# Patient Record
Sex: Male | Born: 1955 | Race: White | Hispanic: No | Marital: Married | Smoking: Never smoker
Health system: Southern US, Community
[De-identification: ages and names within clinical notes are randomized; demographics above are authoritative.]

## PROBLEM LIST (undated history)

## (undated) DIAGNOSIS — M47812 Spondylosis without myelopathy or radiculopathy, cervical region: Secondary | ICD-10-CM

## (undated) DIAGNOSIS — E663 Overweight: Secondary | ICD-10-CM

## (undated) HISTORY — DX: Overweight: E66.3

## (undated) HISTORY — DX: Spondylosis without myelopathy or radiculopathy, cervical region: M47.812

## (undated) HISTORY — PX: VASECTOMY: SHX75

---

## 2003-03-25 ENCOUNTER — Ambulatory Visit (HOSPITAL_COMMUNITY): Admission: RE | Admit: 2003-03-25 | Discharge: 2003-03-25 | Payer: Self-pay | Admitting: Gastroenterology

## 2006-12-26 ENCOUNTER — Encounter: Admission: RE | Admit: 2006-12-26 | Discharge: 2007-01-18 | Payer: Self-pay | Admitting: Neurosurgery

## 2009-01-05 ENCOUNTER — Encounter: Admission: RE | Admit: 2009-01-05 | Discharge: 2009-01-05 | Payer: Self-pay | Admitting: Family Medicine

## 2010-01-18 IMAGING — RF DG UGI W/ HIGH DENSITY W/KUB
19 of 24 series · 19 of 24 positions shown · IV contrast (agent unspecified)
Comparison: None

CLINICAL DATA: Dysphagia

UPPER GI SERIES W/HIGH DENSITY W/KUB
TECHNIQUE: After obtaining a scout radiograph, upper GI series
performed with high density barium and effervescent agent. Thin
barium also used.
Fluoroscopy Time: 1.7 minutes
Contrast: Double contrast upper GI

[Series 1: run · 1 of 1 slices shown (1 of 19)]
[im 1/1]
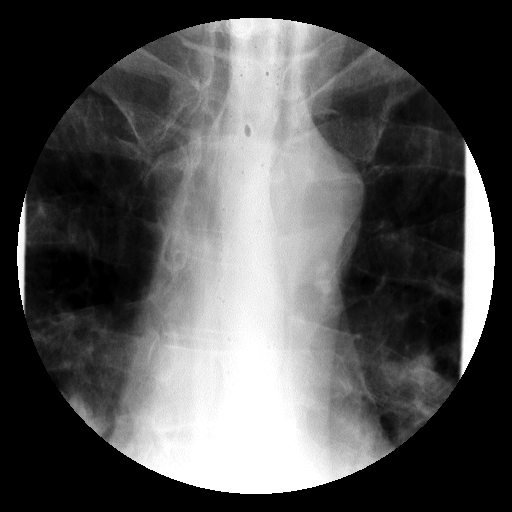

[Series 2: run · 1 of 1 slices shown (2 of 19)]
[im 1/1]
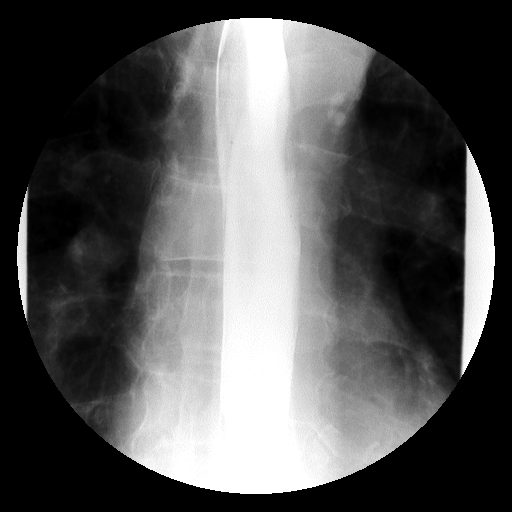

[Series 4: run · 1 of 1 slices shown (3 of 19)]
[im 1/1]
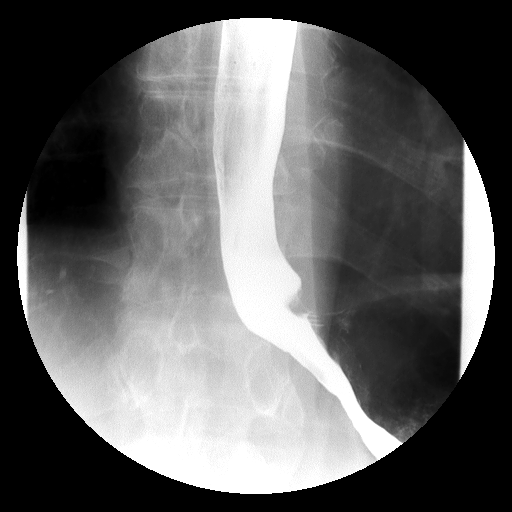

[Series 5: run · 1 of 1 slices shown (4 of 19)]
[im 1/1]
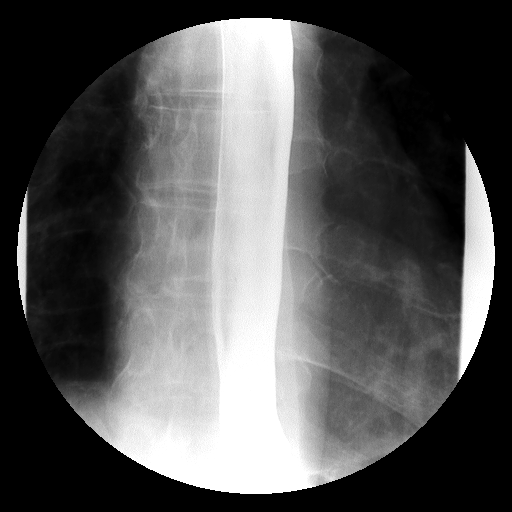

[Series 6: run · 1 of 1 slices shown (5 of 19)]
[im 1/1]
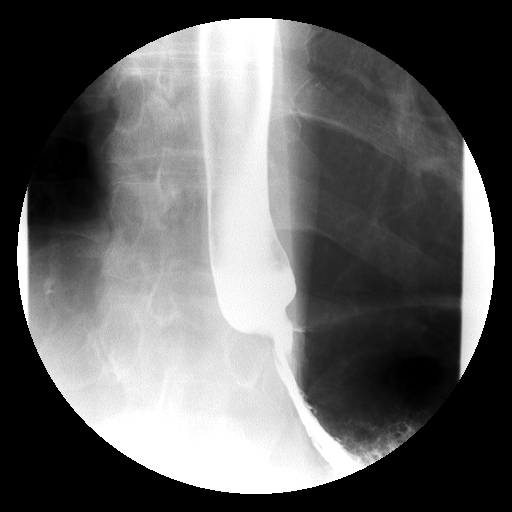

[Series 7: run · 1 of 1 slices shown (6 of 19)]
[im 1/1]
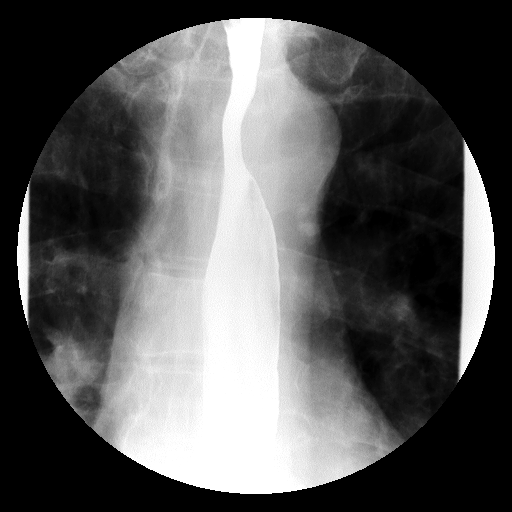

[Series 9: run · 1 of 1 slices shown (7 of 19)]
[im 1/1]
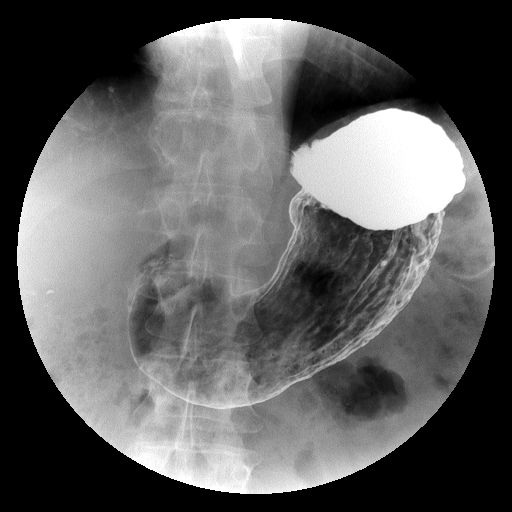

[Series 10: run · 1 of 1 slices shown (8 of 19)]
[im 1/1]
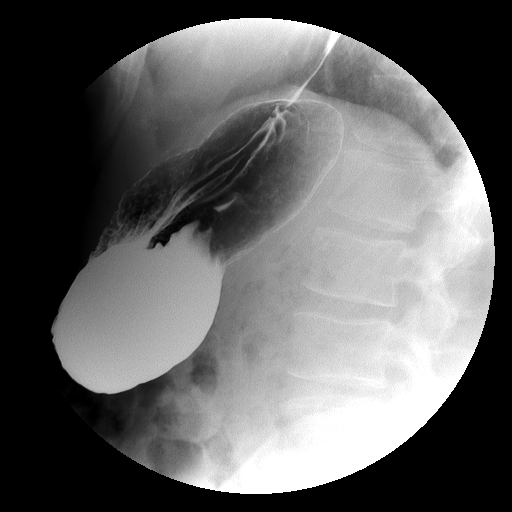

[Series 11: run · 1 of 1 slices shown (9 of 19)]
[im 1/1]
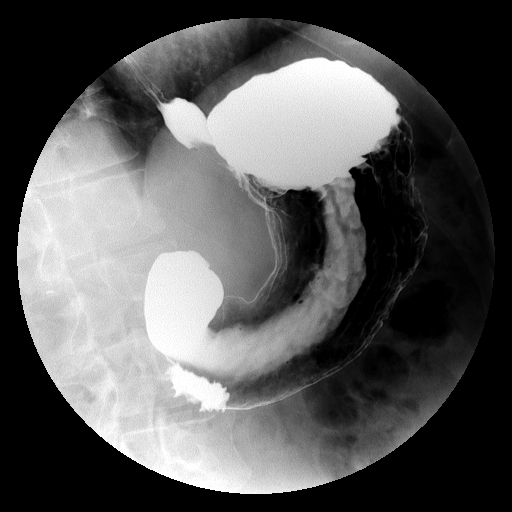

[Series 13: run · 1 of 1 slices shown (10 of 19)]
[im 1/1]
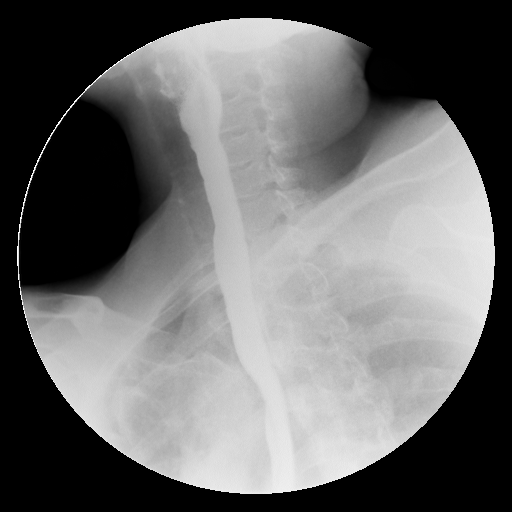

[Series 14: run · 1 of 1 slices shown (11 of 19)]
[im 1/1]
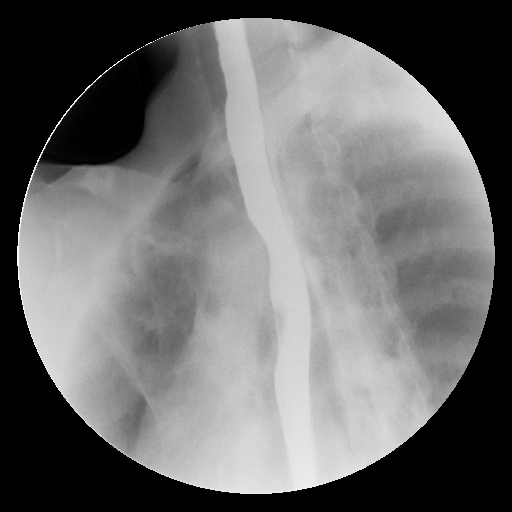

[Series 15: run · 1 of 1 slices shown (12 of 19)]
[im 1/1]
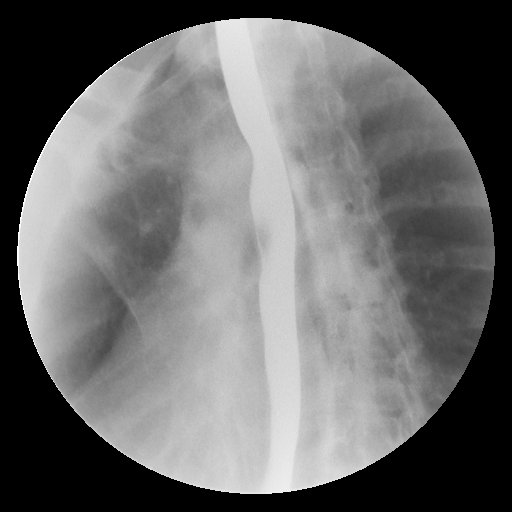

[Series 16: run · 1 of 1 slices shown (13 of 19)]
[im 1/1]
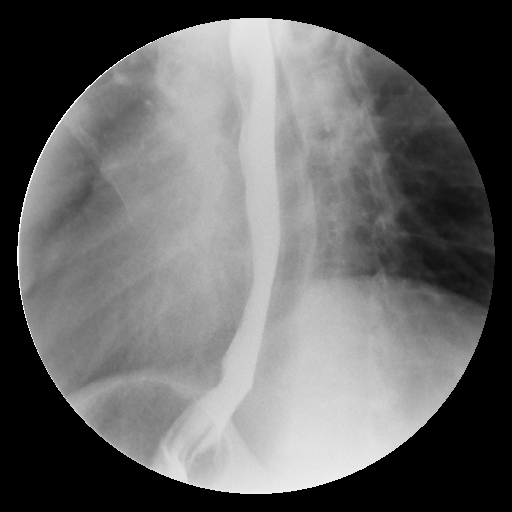

[Series 18: run · 1 of 1 slices shown (14 of 19)]
[im 1/1]
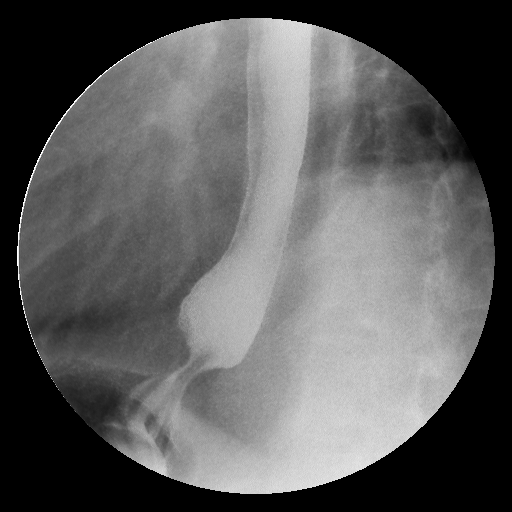

[Series 19: run · 1 of 1 slices shown (15 of 19)]
[im 1/1]
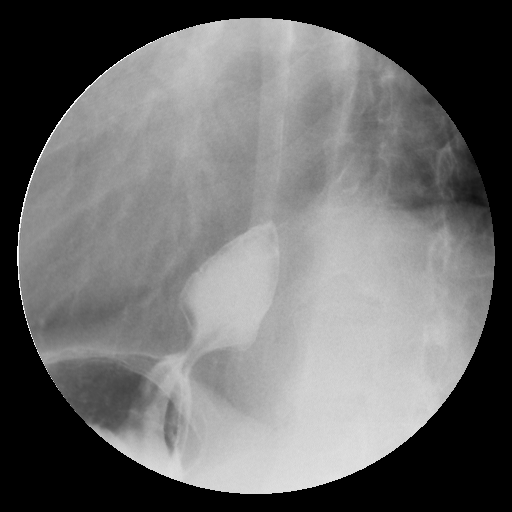

[Series 20: run · 1 of 1 slices shown (16 of 19)]
[im 1/1]
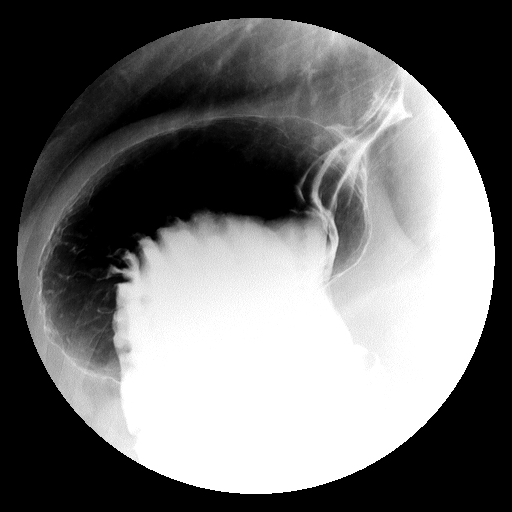

[Series 21: run · 1 of 1 slices shown (17 of 19)]
[im 1/1]
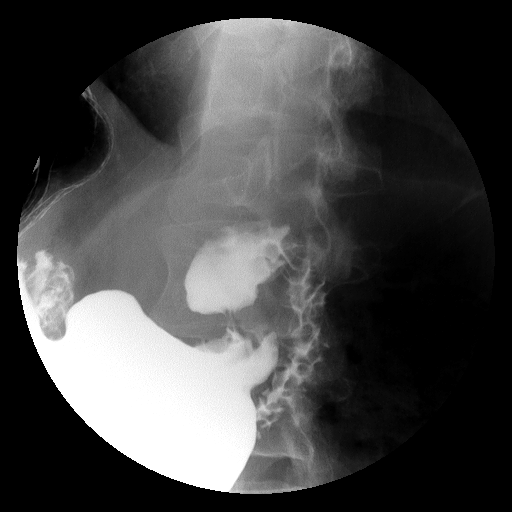

[Series 23: run · 1 of 1 slices shown (18 of 19)]
[im 1/1]
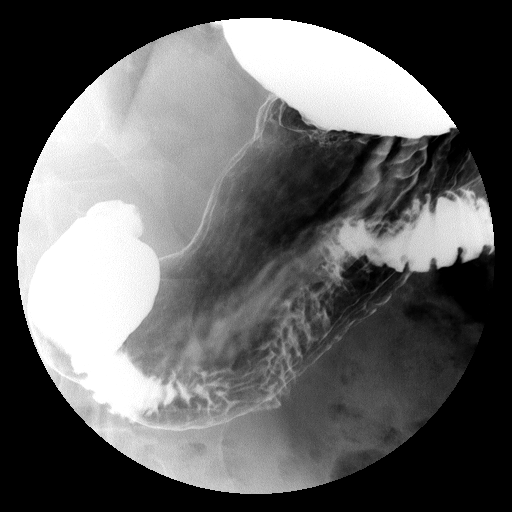

[Series 24: run · 1 of 1 slices shown (19 of 19)]
[im 1/1]
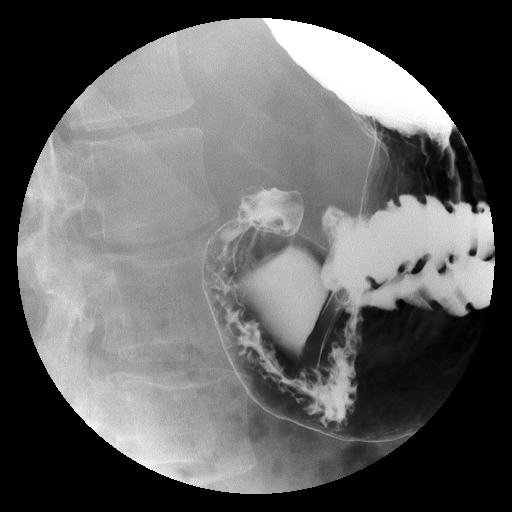

[19 of 24 positions shown; findings below may reference images not displayed]

FINDINGS: A preliminary film of the abdomen shows a nonspecific
bowel gas pattern.  No opaque calculi are noted.

A double contrast study shows the mucosa of the esophagus to be
normal.  A single contrast study shows the swallowing mechanism to
be normal.  Esophageal peristalsis is normal.  There is a small
hiatal hernia present.  Moderate gastroesophageal reflux is noted.
A barium pill was given at the end of the study which passed into
the stomach without delay.  The stomach is normal in contour and
peristalsis.  The duodenal bulb fills well with no ulceration and
the duodenal loop is in normal position.
IMPRESSION: 1.  Small hiatal hernia.
2.  Moderate gastroesophageal reflux.  A barium pill passes into
the stomach without delay.

## 2013-05-28 ENCOUNTER — Other Ambulatory Visit: Payer: Self-pay | Admitting: Otolaryngology

## 2013-05-28 DIAGNOSIS — J029 Acute pharyngitis, unspecified: Secondary | ICD-10-CM

## 2013-05-28 DIAGNOSIS — R1310 Dysphagia, unspecified: Secondary | ICD-10-CM

## 2013-06-03 ENCOUNTER — Ambulatory Visit
Admission: RE | Admit: 2013-06-03 | Discharge: 2013-06-03 | Disposition: A | Discharge status: Home / Self Care | Payer: Commercial Managed Care - PPO | Source: Ambulatory Visit | Attending: Otolaryngology | Admitting: Otolaryngology

## 2013-06-03 DIAGNOSIS — J029 Acute pharyngitis, unspecified: Secondary | ICD-10-CM

## 2013-06-03 DIAGNOSIS — R1310 Dysphagia, unspecified: Secondary | ICD-10-CM

## 2015-09-08 ENCOUNTER — Other Ambulatory Visit: Payer: Self-pay | Admitting: *Deleted

## 2015-09-08 ENCOUNTER — Ambulatory Visit (INDEPENDENT_AMBULATORY_CARE_PROVIDER_SITE_OTHER): Payer: BC Managed Care – PPO | Admitting: Neurology

## 2015-09-08 DIAGNOSIS — R200 Anesthesia of skin: Secondary | ICD-10-CM | POA: Diagnosis not present

## 2015-09-08 DIAGNOSIS — G609 Hereditary and idiopathic neuropathy, unspecified: Secondary | ICD-10-CM

## 2015-09-08 NOTE — Procedures (Addendum)
[facility]  [address], [address]  [place], [place] [address] Tel: [phone] Fax:  [phone] Test Date:  09/08/2015  Patient: Thomas Mclaughlin DOB: Apr 28, 1956 Physician: Narda Amber  Sex: Male Height: 6\' 5"  Ref Phys: Lujean Amel, M.D.  ID#: [id number] Temp: 33.1C Technician: Jerilynn Mages. Dean   Patient Complaints: This is a 59 year old gentleman presenting for evaluation of bilateral feet paresthesias.   NCV & EMG Findings: Extensive electrodiagnostic testing of the right lower extremity and additional studies of the left shows:  1. Left sural and bilateral superficial peroneal sensory responses are reduced in amplitude. The right sural sensory response is low normal. 2. Bilateral tibial and peroneal (EDB) motor responses shows reduced amplitude and mild conduction velocity slowing. Bilateral peroneal motor responses recording at the tibialis anterior are within normal limits. 3. Bilateral tibial H reflex studies are prolonged.   Left tibial F wave is absent. 4. There is no evidence of active or chronic motor axon loss changes affecting any of the tested muscles. Motor unit configuration and recruitment pattern is within normal limits.  Impression: 1. 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  A polyradiculoneuropathy is thought to be less likely; however, if symptoms evolve in the pattern atypical for peripheral neuropathy, further testing may be indicated. 2. There is no evidence of a lumbar radiculopathy.   ___________________________ Narda Amber, DO    Nerve Conduction Studies Anti Sensory Summary Table   Stim Site NR Peak (ms) Norm Peak (ms) P-T Amp (V) Norm P-T Amp  Left Sup Peroneal Anti Sensory (Ant Lat Mall)  33.1C  12 cm    3.3 <4.6 1.8 >4  Right Sup Peroneal Anti Sensory (Ant Lat Mall)  33.1C  12 cm    3.4 <4.6 3.6 >4    Left Sural Anti Sensory (Lat Mall)  33.1C  Calf    4.4 <4.6 3.9 >4  Right Sural Anti Sensory (Lat Mall)  33.1C  Calf    3.6 <4.6 4.2 >4   Motor Summary Table   Stim Site NR Onset (ms) Norm Onset (ms) O-P Amp (mV) Norm O-P Amp Site1 Site2 Delta-0 (ms) Dist (cm) Vel (m/s) Norm Vel (m/s)  Left Peroneal Motor (Ext Dig Brev)  Ankle    3.4 <6.0 1.7 >2.5 B Fib Ankle 10.7 42.0 39 >40  B Fib    14.1  1.5  Poplt B Fib 2.5 10.0 40 >40  Poplt    16.6  1.5         Right Peroneal Motor (Ext Dig Brev)  33.1C  Ankle    3.8 <6.0 1.4 >2.5 B Fib Ankle 9.9 39.0 39 >40  B Fib    13.7  0.7  Poplt B Fib 2.3 8.0 35 >40  Poplt    16.0  0.8         Left Peroneal TA Motor (Tib Ant)  Fib Head    1.9 <4.5 3.8 >3 Poplit Fib Head 2.1 10.0 48 >40  Poplit    4.0  3.6         Right Peroneal TA Motor (Tib Ant)  33.1C  Fib Head    2.9 <4.5 4.1 >3 Poplit Fib Head 1.5 10.0 46 >40  Poplit    4.4  3.4         Left Tibial Motor (Abd Hall Brev)  Ankle    3.6 <6.0 0.9 >4 Knee Ankle 13.4 45.0  34 >40  Knee    17.0  0.5         Right Tibial Motor (Abd Hall Brev)  33.1C  Ankle    4.0 <6.0 1.8 >4 Knee Ankle 12.6 45.0 36 >40  Knee    16.6  1.0          F Wave Studies   NR F-Lat (ms) Lat Norm (ms) L-R F-Lat (ms)  Left Tibial (Mrkrs) (Abd Hallucis)     NR <55    H Reflex Studies   NR H-Lat (ms) Lat Norm (ms) L-R H-Lat (ms)  Left Tibial (Gastroc)     40.00 <35 0.00  Right Tibial (Gastroc)  33.1C     40.00 <35 0.00   EMG   Side Muscle Ins Act Fibs Psw Fasc Number Recrt Dur Dur. Amp Amp. Poly Poly. Comment  Right AntTibialis Nml Nml Nml Nml Nml Nml Nml Nml Nml Nml Nml Nml N/A  Right Gastroc Nml Nml Nml Nml Nml Nml Nml Nml Nml Nml Nml Nml N/A  Right Flex Dig Long Nml Nml Nml Nml Nml Nml Nml Nml Nml Nml Nml Nml N/A  Right RectFemoris Nml Nml Nml Nml Nml Nml Nml Nml Nml Nml Nml Nml N/A  Right GluteusMed Nml Nml Nml Nml Nml Nml Nml Nml Nml Nml Nml Nml N/A  Right BicepsFemS Nml Nml Nml Nml Nml Nml Nml Nml Nml Nml  Nml Nml N/A  Right Lower Lumbar PSP Nml Nml Nml Nml Nml Nml Nml Nml Nml Nml Nml Nml N/A  Left Gastroc Nml Nml Nml Nml Nml Nml Nml Nml Nml Nml Nml Nml N/A  Left AntTibialis Nml Nml Nml Nml Nml Nml Nml Nml Nml Nml Nml Nml N/A      Waveforms:

## 2015-10-06 ENCOUNTER — Encounter: Payer: Self-pay | Admitting: *Deleted

## 2015-10-07 ENCOUNTER — Encounter: Payer: Self-pay | Admitting: Neurology

## 2015-10-07 ENCOUNTER — Ambulatory Visit (INDEPENDENT_AMBULATORY_CARE_PROVIDER_SITE_OTHER): Payer: BC Managed Care – PPO | Admitting: Neurology

## 2015-10-07 VITALS — BP 120/74 | HR 61 | Ht 77.0 in | Wt 228.0 lb

## 2015-10-07 DIAGNOSIS — G609 Hereditary and idiopathic neuropathy, unspecified: Secondary | ICD-10-CM | POA: Diagnosis not present

## 2015-10-07 NOTE — Patient Instructions (Addendum)
1.  Check blood work.  We will call you with the results.  2.  Return to clinic if symptoms worsen

## 2015-10-07 NOTE — Progress Notes (Signed)
[facility] Clinic Note - Initial Visit   Date: 10/07/2015  Thomas Mclaughlin MRN: [id number] DOB: 1956/05/29   Dear Dr. Dorthy Cooler  Thank you for your kind referral of Thomas Mclaughlin for consultation of neuropathy. Although his history is well known to you, please allow [name] to reiterate it for the purpose of our medical record. The patient was accompanied to the clinic by self.    History of Present Illness: Thomas Mclaughlin is a 59 y.o. right-handed Caucasian male with degenerative changes of the cervical spine presenting for evaluation of paresthesias of the feet.    Starting around 2010, he developed numbness involving the toes and sole of the foot which over the past two years also started involving the right foot. He has occasional tingling of the feet and on rare spells, he experienced sharp burning pain.  Symptoms are worse in the evening and relatively constant.  He denies any alleviating or exacerbating factors.  Denies imbalance or any falls.  During the summer of 2016, he was driving from [facility] and he noticed that he was having greater difficulty detecting pressure on the gas and brake pedals, but has not noticed this since then.   He has a 30-year history of stiffness involving the last two fingers, no associated numbness or tingling.  Denies any low back pain.   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   No history of diabetes or alcohol use.  He had EMG performed in September 2016 by myself which was consistent with a distal and symmetric polyneuropathy. Labs showed normal TSH and vitamin B12.   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  A polyradiculoneuropathy is thought to be less likely; however, if symptoms evolve in the pattern atypical for peripheral neuropathy, further testing may be indicated. There is no evidence of a lumbar radiculopathy.  Labs 09/10/2015:  vitamin B12 322, TSH 2.56  Past Medical History  Diagnosis Date  . DJD (degenerative joint disease), cervical   . Overweight     Past Surgical History  Procedure Laterality Date  . None       Medications:  Outpatient Encounter Prescriptions as of 10/07/2015  Medication Sig  . azelastine (ASTELIN) 0.1 % nasal spray Place into both nostrils 2 (two) times daily. Use in each nostril as directed  . phenylephrine (SUDAFED PE) 10 MG TABS tablet Take 10 mg by mouth every 4 (four) hours as needed.   No facility-administered encounter medications on file as of 10/07/2015.     Allergies:  Allergies  Allergen Reactions  . Amoxil [Amoxicillin]     Family History: Family History  Problem Relation Age of Onset  . CVA Father     Deceased  . Dementia Mother   . Breast cancer Mother     Deceased  . Leukemia Sister   . Breast cancer Sister   . Healthy Daughter   . Healthy Son   . Neuropathy Other     Neice    Social History: Social History  Substance Use Topics  . Smoking status: Never Smoker   . Smokeless tobacco: Never Used  . Alcohol Use: 0.0 oz/week    0 Standard drinks or  equivalent per week     Comment: 4-8 times per month, 1 glass of wine   Social History   Social History Narrative   Married with 2 children.     Self employed in publishing.  Lives in a 2 story home.     Education: Oceanographer.  Starting a new job on October 12, 2015.      Review of Systems:  CONSTITUTIONAL: No fevers, chills, night sweats, or weight loss.   EYES: No visual changes or eye pain ENT: No hearing changes.  No history of nose bleeds.   RESPIRATORY: No cough, wheezing and shortness of breath.   CARDIOVASCULAR: Negative for  chest pain, and palpitations.   GI: Negative for abdominal discomfort, blood in stools or black stools.  No recent change in bowel habits.   GU:  No history of incontinence.   MUSCLOSKELETAL: No history of joint pain or swelling.  No myalgias.   SKIN: Negative for lesions, rash, and itching.   HEMATOLOGY/ONCOLOGY: Negative for prolonged bleeding, bruising easily, and swollen nodes.  No history of cancer.   ENDOCRINE: Negative for cold or heat intolerance, polydipsia or goiter.   PSYCH:  No depression or anxiety symptoms.   NEURO: As Above.   Vital Signs:  BP 120/74 mmHg  Pulse 61  Ht '6\' 5"'  (1.956 m)  Wt 228 lb (103.42 kg)  BMI 27.03 kg/m2  SpO2 99% Pain Scale: 0 on a scale of 0-10   General Medical Exam:   General:  Well appearing, comfortable.   Eyes/ENT: see cranial nerve examination.   Neck: No masses appreciated.  Full range of motion without tenderness.  No carotid bruits. Respiratory:  Clear to auscultation, good air entry bilaterally.   Cardiac:  Regular rate and rhythm, no murmur.   Extremities:  No deformities, edema, or skin discoloration.  Skin:  No rashes or lesions.  Neurological Exam: MENTAL STATUS including orientation to time, place, person, recent and remote memory, attention span and concentration, language, and fund of knowledge is normal.  Speech is not dysarthric.  CRANIAL NERVES: II:  No visual field defects.  Unremarkable fundi.   III-IV-VI: Pupils equal round and reactive to light.  Normal conjugate, extra-ocular eye movements in all directions of gaze.  No nystagmus.  No ptosis.   V:  Normal facial sensation.    VII:  Normal facial symmetry and movements.  No pathologic facial reflexes.  VIII:  Normal hearing and vestibular function.   IX-X:  Normal palatal movement.   XI:  Normal shoulder shrug and head rotation.   XII:  Normal tongue strength and range of motion, no deviation or fasciculation.  MOTOR:  No atrophy, fasciculations or abnormal  movements.  No pronator drift.  Tone is normal.    Right Upper Extremity:    Left Upper Extremity:    Deltoid  5/5   Deltoid  5/5   Biceps  5/5   Biceps  5/5   Triceps  5/5   Triceps  5/5   Wrist extensors  5/5   Wrist extensors  5/5   Wrist flexors  5/5   Wrist flexors  5/5   Finger extensors  5/5   Finger extensors  5/5   Finger flexors  5/5   Finger flexors  5/5   Dorsal interossei  5/5   Dorsal interossei  5/5   Abductor pollicis  5/5   Abductor pollicis  5/5   Tone (Ashworth scale)  0  Tone (Ashworth scale)  0  Right Lower Extremity:    Left Lower Extremity:    Hip flexors  5/5   Hip flexors  5/5   Hip extensors  5/5   Hip extensors  5/5   Knee flexors  5/5   Knee flexors  5/5   Knee extensors  5/5   Knee extensors  5/5   Dorsiflexors  5/5   Dorsiflexors  5/5   Plantarflexors  5/5   Plantarflexors  5/5   Toe extensors  5-/5   Toe extensors  5-/5   Toe flexors  4+/5   Toe flexors  4+/5   Tone (Ashworth scale)  0  Tone (Ashworth scale)  0   MSRs:  Right                                                                 Left brachioradialis 2+  brachioradialis 2+  biceps 2+  biceps 2+  triceps 2+  triceps 2+  patellar 2+  patellar 2+  ankle jerk 0  ankle jerk 0  Hoffman no  Hoffman no  plantar response down  plantar response down   SENSORY:  Vibration is reduced to 40% at the knees bilaterally, worse on the left and absent distal to ankles. Proprioception, temperature, and pin prick is intact throughout including feet. Romberg's sign absent.   COORDINATION/GAIT: Normal finger-to- nose-finger and heel-to-shin.  Intact rapid alternating movements bilaterally.  Able to rise from a chair without using arms.  Gait narrow based and stable. Tandem and stressed gait intact.    IMPRESSION: Mr. Housey is a delightful 59 year-old gentleman presenting for evaluation of bilateral feet paresthesias.  Symptoms have been ongoing since 2010 and seem restricted to the toes and plantar surface of  the foot.  His neurological examination shows a distal predominant large fiber peripheral neuropathy. I had extensive discussion with the patient regarding the pathogenesis, etiology, management, and natural course of neuropathy. Neuropathy tends to be slowly progressive, especially if a treatable etiology is not identified.  I would like to test for treatable causes of neuropathy. I discussed that in the vast majority of cases, despite checking for reversible causes, we are unable to find the underlying etiology and management is symptomatic.   His EMG which I performed showed reduced sensory and motor amplitudes of the lower extremities, with mild conduction velocity slowing.  If symptoms evolve in a patchy distribution, additional testing for demyelinating neuropathy can be performed, but at this juncture his findings are most consistent with likely idiopathic peripheral neuropathy.   PLAN/RECOMMENDATIONS:  1.  Check 2hr glucose tolerance test, MMA, ceruloplasmin, copper, SPEP/UPEP with IFE, ESR, CRP 2.  His paresthesias are not bothersome enough to warrant medications, but this can be offered if he develops painful symptoms 3.  Return to clinic as needed   The duration of this appointment visit was 45 minutes of face-to-face time with the patient.  Greater than 50% of this time was spent in counseling, explanation of diagnosis, planning of further management, and coordination of care.   Thank you for allowing me to participate in patient's care.  If I can answer any additional questions, I would be pleased to do so.    Sincerely,    Donika K. Posey Pronto, DO

## 2015-10-07 NOTE — Progress Notes (Signed)
Note routed

## 2015-10-14 ENCOUNTER — Other Ambulatory Visit: Payer: BC Managed Care – PPO

## 2015-10-14 DIAGNOSIS — G609 Hereditary and idiopathic neuropathy, unspecified: Secondary | ICD-10-CM

## 2015-10-14 LAB — SEDIMENTATION RATE: Sed Rate: 5 mm/hr (ref 0–22)

## 2015-10-14 LAB — C-REACTIVE PROTEIN

## 2015-10-14 LAB — GLUCOSE TOLERANCE, 2 HOURS
GLUCOSE 1 HOUR GTT: 90 mg/dL
GLUCOSE, 2 HOUR: 76 mg/dL
GLUCOSE, FASTING: 105 mg/dL — AB (ref 70–99)

## 2015-10-15 ENCOUNTER — Ambulatory Visit: Payer: BC Managed Care – PPO | Admitting: Neurology

## 2015-10-16 LAB — SPEP & IFE WITH QIG
ALBUMIN ELP: 4.2 g/dL (ref 3.8–4.8)
Alpha-1-Globulin: 0.3 g/dL (ref 0.2–0.3)
Alpha-2-Globulin: 0.6 g/dL (ref 0.5–0.9)
BETA 2: 0.4 g/dL (ref 0.2–0.5)
BETA GLOBULIN: 0.5 g/dL (ref 0.4–0.6)
Gamma Globulin: 1.3 g/dL (ref 0.8–1.7)
IGA: 275 mg/dL (ref 68–379)
IGM, SERUM: 80 mg/dL (ref 41–251)
IgG (Immunoglobin G), Serum: 1410 mg/dL (ref 650–1600)
Total Protein, Serum Electrophoresis: 7.2 g/dL (ref 6.1–8.1)

## 2015-10-16 LAB — UIFE/LIGHT CHAINS/TP QN, 24-HR UR
ALBUMIN, U: DETECTED
ALPHA 1 UR: DETECTED — AB
ALPHA 2 UR: DETECTED — AB
Beta, Urine: DETECTED — AB
Gamma Globulin, Urine: DETECTED — AB
TOTAL PROTEIN, URINE-UPE24: 5 mg/dL (ref 5–25)

## 2015-10-17 LAB — METHYLMALONIC ACID, SERUM: METHYLMALONIC ACID, QUANT: 64 nmol/L — AB (ref 87–318)

## 2015-10-17 LAB — COPPER, SERUM: COPPER: 90 ug/dL (ref 70–175)

## 2015-10-19 LAB — CERULOPLASMIN: Ceruloplasmin: 23 mg/dL (ref 18–36)

## 2015-11-13 ENCOUNTER — Ambulatory Visit (INDEPENDENT_AMBULATORY_CARE_PROVIDER_SITE_OTHER): Payer: Commercial Managed Care - PPO | Admitting: Podiatry

## 2015-11-13 ENCOUNTER — Ambulatory Visit: Payer: Commercial Managed Care - PPO

## 2015-11-13 ENCOUNTER — Ambulatory Visit (INDEPENDENT_AMBULATORY_CARE_PROVIDER_SITE_OTHER): Payer: Commercial Managed Care - PPO

## 2015-11-13 ENCOUNTER — Encounter: Payer: Self-pay | Admitting: Podiatry

## 2015-11-13 VITALS — BP 131/97 | HR 59 | Resp 16 | Ht 77.0 in | Wt 225.0 lb

## 2015-11-13 DIAGNOSIS — G629 Polyneuropathy, unspecified: Secondary | ICD-10-CM

## 2015-11-13 DIAGNOSIS — M79672 Pain in left foot: Secondary | ICD-10-CM

## 2015-11-13 DIAGNOSIS — M79671 Pain in right foot: Secondary | ICD-10-CM | POA: Diagnosis not present

## 2015-11-13 DIAGNOSIS — M779 Enthesopathy, unspecified: Secondary | ICD-10-CM | POA: Diagnosis not present

## 2015-11-13 NOTE — Progress Notes (Signed)
   Subjective:    Patient ID: Thomas Mclaughlin, male    DOB: 06-10-1956, 59 y.o.   MRN: [id number]  HPI   Patient presents with bilateral foot pain; ball of feet; tingling in all toes. On Right foot-2nd toe-tip of toe "feels numb; feels worse at end of day". Pt stated, "right foot feels worse"; x2-3 yrs.   Review of Systems  All other systems reviewed and are negative.      Objective:   Physical Exam        Assessment & Plan:

## 2015-11-15 NOTE — Progress Notes (Signed)
Subjective:     Patient ID: Thomas Mclaughlin, male   DOB: 07-May-1956, 59 y.o.   MRN: [id number]  HPI patient presents with moderate forefoot pain stating that he has difficulty walking distances and he just wanted to make sure his feet were oh   Review of Systems  All other systems reviewed and are negative.      Objective:   Physical Exam  Constitutional: He is oriented to person, place, and time.  Cardiovascular: Intact distal pulses.   Musculoskeletal: Normal range of motion.  Neurological: He is oriented to person, place, and time.  Skin: Skin is warm.  Nursing note and vitals reviewed.  neurovascular status intact muscle strength adequate range of motion within normal limits with patient noted to have discomfort at the tip second toe right moderate reduction of sharp dull and vibratory and also no muscle strength loss of the extensors flexors tendons.     Assessment:     Possibility for neuropathic condition versus inflammatory capsulitis    Plan:     H&P and reviewed conditions and previous treatments which have not been successful. We are going to improve his vitamin complexes and I gave him instructions on this and he is given a have a pair of orthotics made by his son who is in school. Patient be seen back for [name] to recheck as symptoms indicate and may require neurological studies depending on results

## 2016-01-12 ENCOUNTER — Ambulatory Visit: Payer: Commercial Managed Care - PPO | Admitting: Allergy and Immunology

## 2016-04-11 ENCOUNTER — Ambulatory Visit (INDEPENDENT_AMBULATORY_CARE_PROVIDER_SITE_OTHER): Payer: Commercial Managed Care - PPO | Admitting: Allergy and Immunology

## 2016-04-11 ENCOUNTER — Encounter: Payer: Self-pay | Admitting: Allergy and Immunology

## 2016-04-11 VITALS — BP 104/66 | HR 72 | Temp 98.1°F | Resp 16 | Ht 77.0 in | Wt 225.8 lb

## 2016-04-11 DIAGNOSIS — J3089 Other allergic rhinitis: Secondary | ICD-10-CM

## 2016-04-11 DIAGNOSIS — R059 Cough, unspecified: Secondary | ICD-10-CM | POA: Insufficient documentation

## 2016-04-11 DIAGNOSIS — R05 Cough: Secondary | ICD-10-CM

## 2016-04-11 MED ORDER — AZELASTINE-FLUTICASONE 137-50 MCG/ACT NA SUSP: 1.0000 | Freq: Two times a day (BID) | NASAL | Status: DC

## 2016-04-11 NOTE — Progress Notes (Signed)
New Patient Note  RE: Thomas Mclaughlin MRN: [id number] DOB: [birth year] Date of Office Visit: 04/11/2016  Referring provider: Lujean Amel, MD Primary care provider: Lujean Amel, MD  Chief Complaint: Cough and Allergic Rhinitis    History of present illness: HPI Comments: Cote "Elta Guadeloupe" Floerke is a 60 y.o. male presenting today for consultation of cough and rhinitis.  He reports that this past winter he had "a series of colds" with a cough that lingered for weeks afterwards.  The symptoms typically started with nasal congestion, rhinorrhea, thick postnasal drainage, sore throat and evolved into a wet cough initially and then a nonproductive cough.  There is no diurnal variation with the cough.  He experiences nasal/sinus symptoms year round which include nasal congestion, postnasal drainage, rhinorrhea, sinus pressure over the cheek bones, and ear pressure.  These symptoms tend to be more frequent/severe in the spring and summertime.  He denies significant ocular symptoms.  He has found some symptom relief with pseudoephedrine.  He does not take antihistamines because they cause somnolence.   Assessment and plan: Allergic rhinitis with a nonallergic component  Aeroallergen avoidance measures have been discussed and provided in written form.  A prescription has been provided for Dymista (azelastine/fluticasone) nasal spray, 1 spray per nostril twice daily as needed. Proper nasal spray technique has been discussed and demonstrated.  Continue nasal saline lavage prior to medicated nasal sprays.  Guaifenesin 1200 mg (plus/minus pseudoephedrine 120 mg) twice daily as needed with adequate hydration. Pseudoephedrine is only to be used for short-term relief of nasal/sinus congestion. Long-term use is discouraged due to potential side effects.   Cough The patient's history and physical examination suggest upper airway cough syndrome.  Spirometry today reveals normal ventilatory function. We will  aggressively treat postnasal drainage and evaluate results.  Treatment plan as outlined above.  If the coughing persists or progresses despite this plan, we will evaluate further.    Meds ordered this encounter  Medications  . Azelastine-Fluticasone (DYMISTA) 137-50 MCG/ACT SUSP    Sig: Place 1 spray into both nostrils 2 (two) times daily.    Dispense:  1 Bottle    Refill:  3    Place on hold patient will call    Diagnositics: Spirometry: FVC was 4.72 L and FEV1 was 4.43 L (93% predicted) without significant post bronchodilator improvement. Epicutaneous testing: Negative despite a positive histamine control. Intradermal testing: Positive to grass pollen.    Physical examination: Blood pressure 104/66, pulse 72, temperature 98.1 F (36.7 C), temperature source Oral, resp. rate 16, height 6\' 5"  (1.956 m), weight 225 lb 12 oz (102.4 kg).  General: Alert, interactive, in no acute distress. HEENT: TMs pearly gray, turbinates moderately edematous without discharge, post-pharynx moderately erythematous. Neck: Supple without lymphadenopathy. Lungs: Clear to auscultation without wheezing, rhonchi or rales. CV: Normal S1, S2 without murmurs. Abdomen: Nondistended, nontender. Skin: Warm and dry, without lesions or rashes. Extremities:  No clubbing, cyanosis or edema. Neuro:   Grossly intact.  Review of systems:  Review of Systems  Constitutional: Negative for fever, chills and weight loss.  HENT: Positive for congestion. Negative for nosebleeds.   Eyes: Negative for blurred vision.  Respiratory: Positive for cough. Negative for hemoptysis and wheezing.   Cardiovascular: Negative for chest pain.  Gastrointestinal: Negative for diarrhea and constipation.  Genitourinary: Negative for dysuria.  Musculoskeletal: Negative for myalgias and joint pain.  Skin: Negative for itching and rash.  Neurological: Positive for headaches. Negative for dizziness.  Endo/Heme/Allergies: Positive for  environmental allergies.  Does not bruise/bleed easily.    Past medical history:  Past Medical History  Diagnosis Date  . DJD (degenerative joint disease), cervical   . Overweight     Past surgical history:  Past Surgical History  Procedure Laterality Date  . Vasectomy      Family history: Family History  Problem Relation Age of Onset  . CVA Father     Deceased  . Dementia Mother   . Breast cancer Mother     Deceased  . Leukemia Sister   . Breast cancer Sister   . Healthy Daughter   . Healthy Son   . Neuropathy Other     Neice  . Allergic rhinitis Neg Hx   . Angioedema Neg Hx   . Asthma Neg Hx   . Atopy Neg Hx   . Eczema Neg Hx   . Immunodeficiency Neg Hx   . Urticaria Neg Hx     Social history: Social History   Social History  . Marital Status: Single    Spouse Name: N/A  . Number of Children: N/A  . Years of Education: N/A   Occupational History  . Not on file.   Social History Main Topics  . Smoking status: Never Smoker   . Smokeless tobacco: Never Used  . Alcohol Use: 0.0 oz/week    0 Standard drinks or equivalent per week     Comment: 4-8 times per month, 1 glass of wine  . Drug Use: No  . Sexual Activity: Not on file   Other Topics Concern  . Not on file   Social History Narrative   Married with 2 children.     Self employed in publishing.  Lives in a 2 story home.     Education: Oceanographer.  Starting a new job on October 12, 2015.     Environmental History: The patient lives in a 61 year old house with carpeting throughout and central air/heat.  There is a cat in house which does not have access to his bedroom.  He is a nonsmoker.    Medication List       This list is accurate as of: 04/11/16  1:26 PM.  Always use your most recent med list.               Alpha-Lipoic Acid 150 MG Caps  Take by mouth.     azelastine 0.1 % nasal spray  Commonly known as:  ASTELIN  Place into both nostrils as needed. Use in each nostril as directed       Azelastine-Fluticasone 137-50 MCG/ACT Susp  Commonly known as:  DYMISTA  Place 1 spray into both nostrils 2 (two) times daily.     ferrous sulfate 325 (65 FE) MG tablet  Take 325 mg by mouth daily with breakfast.     MULTIVITAMIN ADULT PO  Take by mouth.     phenylephrine 10 MG Tabs tablet  Commonly known as:  SUDAFED PE  Take 10 mg by mouth every 4 (four) hours as needed. Reported on 04/11/2016     vitamin B-12 1000 MCG tablet  Commonly known as:  CYANOCOBALAMIN  Take by mouth.        Known medication allergies: Allergies  Allergen Reactions  . Amoxil [Amoxicillin]     I appreciate the opportunity to take part in this Mark's care. Please do not hesitate to contact me with questions.  Sincerely,   R. Edgar Frisk, MD

## 2016-04-11 NOTE — Patient Instructions (Signed)
Allergic rhinitis with a nonallergic component  Aeroallergen avoidance measures have been discussed and provided in written form.  A prescription has been provided for Dymista (azelastine/fluticasone) nasal spray, 1 spray per nostril twice daily as needed. Proper nasal spray technique has been discussed and demonstrated.  Continue nasal saline lavage prior to medicated nasal sprays.  Guaifenesin 1200 mg (plus/minus pseudoephedrine 120 mg) twice daily as needed with adequate hydration. Pseudoephedrine is only to be used for short-term relief of nasal/sinus congestion. Long-term use is discouraged due to potential side effects.   Cough The patient's history and physical examination suggest upper airway cough syndrome.  Spirometry today reveals normal ventilatory function. We will aggressively treat postnasal drainage and evaluate results.  Treatment plan as outlined above.  If the coughing persists or progresses despite this plan, we will evaluate further.   Return in about 6 months (around 10/12/2016), or if symptoms worsen or fail to improve.  Reducing Pollen Exposure  The American Academy of Allergy, Asthma and Immunology suggests the following steps to reduce your exposure to pollen during allergy seasons.    1. Do not hang sheets or clothing out to dry; pollen may collect on these items. 2. Do not mow lawns or spend time around freshly cut grass; mowing stirs up pollen. 3. Keep windows closed at night.  Keep car windows closed while driving. 4. Minimize morning activities outdoors, a time when pollen counts are usually at their highest. 5. Stay indoors as much as possible when pollen counts or humidity is high and on windy days when pollen tends to remain in the air longer. 6. Use air conditioning when possible.  Many air conditioners have filters that trap the pollen spores. 7. Use a HEPA room air filter to remove pollen form the indoor air you breathe.

## 2016-04-11 NOTE — Assessment & Plan Note (Signed)
The patient's history and physical examination suggest upper airway cough syndrome.  Spirometry today reveals normal ventilatory function. We will aggressively treat postnasal drainage and evaluate results.  Treatment plan as outlined above.  If the coughing persists or progresses despite this plan, we will evaluate further. 

## 2016-04-11 NOTE — Assessment & Plan Note (Signed)
   Aeroallergen avoidance measures have been discussed and provided in written form.  A prescription has been provided for Dymista (azelastine/fluticasone) nasal spray, 1 spray per nostril twice daily as needed. Proper nasal spray technique has been discussed and demonstrated.  Continue nasal saline lavage prior to medicated nasal sprays.  Guaifenesin 1200 mg (plus/minus pseudoephedrine 120 mg) twice daily as needed with adequate hydration. Pseudoephedrine is only to be used for short-term relief of nasal/sinus congestion. Long-term use is discouraged due to potential side effects.

## 2016-04-12 ENCOUNTER — Other Ambulatory Visit: Payer: Self-pay

## 2016-04-12 MED ORDER — FLUTICASONE PROPIONATE 50 MCG/ACT NA SUSP: 1.0000 | Freq: Two times a day (BID) | NASAL | Status: AC

## 2016-04-12 MED ORDER — AZELASTINE HCL 0.1 % NA SOLN: 1.0000 | Freq: Two times a day (BID) | NASAL | Status: DC

## 2016-04-12 NOTE — Telephone Encounter (Signed)
Switched to [facility] and azelastine one spray of each twice daily since insurance would not cover Dymista.

## 2017-07-21 DIAGNOSIS — J0100 Acute maxillary sinusitis, unspecified: Secondary | ICD-10-CM | POA: Diagnosis not present

## 2017-09-20 DIAGNOSIS — H5213 Myopia, bilateral: Secondary | ICD-10-CM | POA: Diagnosis not present

## 2018-02-02 DIAGNOSIS — L570 Actinic keratosis: Secondary | ICD-10-CM | POA: Diagnosis not present

## 2018-02-02 DIAGNOSIS — L821 Other seborrheic keratosis: Secondary | ICD-10-CM | POA: Diagnosis not present

## 2018-02-02 DIAGNOSIS — L814 Other melanin hyperpigmentation: Secondary | ICD-10-CM | POA: Diagnosis not present

## 2018-02-02 DIAGNOSIS — D229 Melanocytic nevi, unspecified: Secondary | ICD-10-CM | POA: Diagnosis not present

## 2018-04-09 DIAGNOSIS — Z125 Encounter for screening for malignant neoplasm of prostate: Secondary | ICD-10-CM | POA: Diagnosis not present

## 2018-04-09 DIAGNOSIS — L84 Corns and callosities: Secondary | ICD-10-CM | POA: Diagnosis not present

## 2018-04-09 DIAGNOSIS — Z0000 Encounter for general adult medical examination without abnormal findings: Secondary | ICD-10-CM | POA: Diagnosis not present

## 2018-04-09 DIAGNOSIS — Z13220 Encounter for screening for lipoid disorders: Secondary | ICD-10-CM | POA: Diagnosis not present

## 2019-02-04 DIAGNOSIS — D225 Melanocytic nevi of trunk: Secondary | ICD-10-CM | POA: Diagnosis not present

## 2019-02-04 DIAGNOSIS — D1801 Hemangioma of skin and subcutaneous tissue: Secondary | ICD-10-CM | POA: Diagnosis not present

## 2019-02-04 DIAGNOSIS — L821 Other seborrheic keratosis: Secondary | ICD-10-CM | POA: Diagnosis not present

## 2019-09-27 ENCOUNTER — Other Ambulatory Visit: Payer: Self-pay

## 2019-09-27 ENCOUNTER — Other Ambulatory Visit (INDEPENDENT_AMBULATORY_CARE_PROVIDER_SITE_OTHER): Payer: Commercial Managed Care - PPO

## 2019-09-27 ENCOUNTER — Ambulatory Visit (INDEPENDENT_AMBULATORY_CARE_PROVIDER_SITE_OTHER): Payer: Commercial Managed Care - PPO | Admitting: Neurology

## 2019-09-27 ENCOUNTER — Encounter: Payer: Self-pay | Admitting: Neurology

## 2019-09-27 VITALS — BP 120/60 | HR 58 | Ht 77.0 in | Wt 228.0 lb

## 2019-09-27 DIAGNOSIS — E538 Deficiency of other specified B group vitamins: Secondary | ICD-10-CM | POA: Diagnosis not present

## 2019-09-27 DIAGNOSIS — G609 Hereditary and idiopathic neuropathy, unspecified: Secondary | ICD-10-CM

## 2019-09-27 NOTE — Progress Notes (Signed)
Follow-up Visit   Date: 09/27/19   Thomas Mclaughlin MRN: [id number] DOB: 1956/06/26   Interim History: Thomas Mclaughlin is a 63 y.o. right-handed Caucasian male with degenerative changes of the cervical spine returning to the clinic for follow-up of idiopathic peripheral neuropathy.  The patient was accompanied to the clinic by self.  History of present illness: Starting around 2010, he developed numbness involving the toes and sole of the foot which over the past two years also started involving the right foot. He has occasional tingling of the feet and on rare spells, he experienced sharp burning pain.  Symptoms are worse in the evening and relatively constant.  He denies any alleviating or exacerbating factors.  Denies imbalance or any falls.  During the summer of 2016, he was driving from [facility] and he noticed that he was having greater difficulty detecting pressure on the gas and brake pedals, but has not noticed this since then.   He has a 30-year history of stiffness involving the last two fingers, no associated numbness or tingling.  Denies any low back pain.   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   UPDATE 09/27/2019: He was last seen in 2016 and is here with complaints of progressive neuropathy.  Over the past few years, he has noticed numbness involving the greater portion of the foot and extending over the sole end of the dorsum of the feet.  He does not have any numbness above the ankles or into the hands.  He denies any weakness or imbalance.  He does not have tingling or burning pain in the feet.  Medications:  Current Outpatient Medications on File Prior to Visit  Medication Sig Dispense Refill  . Alpha-Lipoic Acid 150 MG CAPS Take by mouth.    . fluticasone (FLONASE) 50 MCG/ACT nasal spray Place 1 spray into both nostrils 2 (two) times daily. 16 g 5  . azelastine (ASTELIN) 0.1 % nasal spray Place 1 spray  into both nostrils 2 (two) times daily. Use in each nostril as directed (Patient not taking: Reported on 09/27/2019) 30 mL 5  . Azelastine-Fluticasone (DYMISTA) 137-50 MCG/ACT SUSP Place 1 spray into both nostrils 2 (two) times daily. (Patient not taking: Reported on 09/27/2019) 1 Bottle 3  . ferrous sulfate 325 (65 FE) MG tablet Take 325 mg by mouth daily with breakfast.    . Multiple Vitamins-Minerals (MULTIVITAMIN ADULT PO) Take by mouth.    . phenylephrine (SUDAFED PE) 10 MG TABS tablet Take 10 mg by mouth every 4 (four) hours as needed. Reported on 04/11/2016    . vitamin B-12 (CYANOCOBALAMIN) 1000 MCG tablet Take by mouth.     No current facility-administered medications on file prior to visit.     Allergies:  Allergies  Allergen Reactions  . Amoxil [Amoxicillin]     Review of Systems:  CONSTITUTIONAL: No fevers, chills, night sweats, or weight loss.  EYES: No visual changes or eye pain ENT: No hearing changes.  No history of nose bleeds.   RESPIRATORY: No cough, wheezing and shortness of breath.   CARDIOVASCULAR: Negative for chest pain, and palpitations.   GI: Negative for abdominal discomfort, blood in stools or black stools.  No recent change in bowel habits.   GU:  No history of incontinence.   MUSCLOSKELETAL: No history of joint pain or swelling.  No myalgias.   SKIN: Negative for lesions, rash, and itching.   ENDOCRINE:  Negative for cold or heat intolerance, polydipsia or goiter.   PSYCH:  No depression or anxiety symptoms.   NEURO: As Above.   Vital Signs:  BP 120/60   Pulse (!) 58   Ht _0  (1.956 m)   Wt 228 lb (103.4 kg)   SpO2 98%   BMI 27.04 kg/m    General Medical Exam:   General:  Well appearing, comfortable  Eyes/ENT: see cranial nerve examination.   Neck:  No carotid bruits. Respiratory:  Clear to auscultation, good air entry bilaterally.   Cardiac:  Regular rate and rhythm, no murmur.   Ext:  No edema   Neurological Exam: MENTAL STATUS including  orientation to time, place, person, recent and remote memory, attention span and concentration, language, and fund of knowledge is normal.  Speech is not dysarthric.  CRANIAL NERVES:  No visual field defects.  Pupils equal round and reactive to light.  Normal conjugate, extra-ocular eye movements in all directions of gaze.  No ptosis.  Face is symmetric. Palate elevates symmetrically.  Tongue is midline.  MOTOR:  Motor strength is 5/5 in all extremities.  No atrophy, fasciculations or abnormal movements.  No pronator drift.  Tone is normal.    MSRs:  Reflexes are 2+/4 throughout, except 1+4 bilateral Achilles.  Plantars are downgoing.  SENSORY: Vibration is reduced to 30% at the ankles and trace at the great toe bilaterally.  Temperature is reduced over the dorsum of the feet.  Pinprick and light touch is intact.  Proprioception is intact.  Romberg testing is negative.    COORDINATION/GAIT:  Normal finger-to- nose-finger.  Intact rapid alternating movements bilaterally.  Gait narrow based and stable.  Stressed and tandem gait is intact.  Data: NCS/EMG of the legs 09/08/2015: 1. 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  A polyradiculoneuropathy is thought to be less likely; however, if symptoms evolve in the pattern atypical for peripheral neuropathy, further testing may be indicated. 2. There is no evidence of a lumbar radiculopathy.  Labs 10/14/2015: Glucose tolerance test borderline, MMA 64, ESR 5 , CRP <0.1, copper 90, ceruloplasmin 23, SPEP with IFE no M protein   IMPRESSION/PLAN: 1.  Idiopathic peripheral neuropathy, progressing into the feet.  I had a long discussion with patient describing the natural progression of neuropathy and that over time, it can extend into the the feet and lower legs, however, this process is very slow.  His clinical exam shows  purely sensory deficits.  He does not have any weakness or imbalance.  Fortunately, he does not have any painful paresthesias.  Prior laboratory work-up did not show a treatable cause.  He does not have history of diabetes or heavy alcohol use. Patient educated on daily foot inspection I counseled patient on driving safety discussed at home controls can be added to the ankles, should his neuropathy progressed to the point where he cannot safely manipulate pedals.  At this point, I do not see any indication for this.  2.  Low vitamin B12.  Check vitamin B12 level  He will follow-up with me in ~ 2 years, or sooner as needed  Greater than 50% of this 30 minute visit was spent in counseling, explanation of diagnosis, planning of further management, and coordination of care.  Thank you for allowing me to participate in patient's care.  If I can answer any additional questions, I would be pleased to do so.  Sincerely,    Elissa Grieshop K. Posey Pronto, DO

## 2019-09-27 NOTE — Patient Instructions (Addendum)
It was good to see you today.  Start checking your feet daily  Check vitamin B12 today  Return to clinic as needed   Your provider has requested that you have labwork completed today. Please go to [facility] ([id number]) on the second floor of this building before leaving the office today. You do not need to check in. If you are not called within 15 minutes please check with the front desk.

## 2019-09-28 LAB — VITAMIN B12: Vitamin B-12: 383 pg/mL (ref 200–1100)

## 2020-12-31 DIAGNOSIS — N401 Enlarged prostate with lower urinary tract symptoms: Secondary | ICD-10-CM | POA: Diagnosis not present

## 2020-12-31 DIAGNOSIS — Z0000 Encounter for general adult medical examination without abnormal findings: Secondary | ICD-10-CM | POA: Diagnosis not present

## 2020-12-31 DIAGNOSIS — Z1159 Encounter for screening for other viral diseases: Secondary | ICD-10-CM | POA: Diagnosis not present

## 2020-12-31 DIAGNOSIS — Z79899 Other long term (current) drug therapy: Secondary | ICD-10-CM | POA: Diagnosis not present

## 2020-12-31 DIAGNOSIS — G5793 Unspecified mononeuropathy of bilateral lower limbs: Secondary | ICD-10-CM | POA: Diagnosis not present

## 2021-01-01 DIAGNOSIS — Z125 Encounter for screening for malignant neoplasm of prostate: Secondary | ICD-10-CM | POA: Diagnosis not present

## 2021-01-01 DIAGNOSIS — Z79899 Other long term (current) drug therapy: Secondary | ICD-10-CM | POA: Diagnosis not present

## 2021-01-01 DIAGNOSIS — Z136 Encounter for screening for cardiovascular disorders: Secondary | ICD-10-CM | POA: Diagnosis not present

## 2021-01-01 DIAGNOSIS — Z1159 Encounter for screening for other viral diseases: Secondary | ICD-10-CM | POA: Diagnosis not present

## 2021-02-04 DIAGNOSIS — L718 Other rosacea: Secondary | ICD-10-CM | POA: Diagnosis not present

## 2021-02-04 DIAGNOSIS — L814 Other melanin hyperpigmentation: Secondary | ICD-10-CM | POA: Diagnosis not present

## 2021-02-04 DIAGNOSIS — D1801 Hemangioma of skin and subcutaneous tissue: Secondary | ICD-10-CM | POA: Diagnosis not present

## 2021-02-04 DIAGNOSIS — L821 Other seborrheic keratosis: Secondary | ICD-10-CM | POA: Diagnosis not present

## 2021-02-04 DIAGNOSIS — L819 Disorder of pigmentation, unspecified: Secondary | ICD-10-CM | POA: Diagnosis not present

## 2021-02-04 DIAGNOSIS — L110 Acquired keratosis follicularis: Secondary | ICD-10-CM | POA: Diagnosis not present

## 2021-02-04 DIAGNOSIS — D229 Melanocytic nevi, unspecified: Secondary | ICD-10-CM | POA: Diagnosis not present

## 2021-02-04 DIAGNOSIS — I8393 Asymptomatic varicose veins of bilateral lower extremities: Secondary | ICD-10-CM | POA: Diagnosis not present

## 2021-02-04 DIAGNOSIS — L853 Xerosis cutis: Secondary | ICD-10-CM | POA: Diagnosis not present

## 2021-02-25 DIAGNOSIS — Z823 Family history of stroke: Secondary | ICD-10-CM | POA: Diagnosis not present

## 2021-02-25 DIAGNOSIS — M1990 Unspecified osteoarthritis, unspecified site: Secondary | ICD-10-CM | POA: Diagnosis not present

## 2021-02-25 DIAGNOSIS — Z803 Family history of malignant neoplasm of breast: Secondary | ICD-10-CM | POA: Diagnosis not present

## 2021-02-25 DIAGNOSIS — R030 Elevated blood-pressure reading, without diagnosis of hypertension: Secondary | ICD-10-CM | POA: Diagnosis not present

## 2021-02-25 DIAGNOSIS — L719 Rosacea, unspecified: Secondary | ICD-10-CM | POA: Diagnosis not present

## 2021-02-25 DIAGNOSIS — H269 Unspecified cataract: Secondary | ICD-10-CM | POA: Diagnosis not present

## 2021-02-25 DIAGNOSIS — Z880 Allergy status to penicillin: Secondary | ICD-10-CM | POA: Diagnosis not present

## 2021-02-25 DIAGNOSIS — Z791 Long term (current) use of non-steroidal anti-inflammatories (NSAID): Secondary | ICD-10-CM | POA: Diagnosis not present

## 2021-02-25 DIAGNOSIS — J309 Allergic rhinitis, unspecified: Secondary | ICD-10-CM | POA: Diagnosis not present

## 2021-10-05 DIAGNOSIS — H5213 Myopia, bilateral: Secondary | ICD-10-CM | POA: Diagnosis not present

## 2022-01-06 DIAGNOSIS — G5793 Unspecified mononeuropathy of bilateral lower limbs: Secondary | ICD-10-CM | POA: Diagnosis not present

## 2022-01-06 DIAGNOSIS — Z23 Encounter for immunization: Secondary | ICD-10-CM | POA: Diagnosis not present

## 2022-01-06 DIAGNOSIS — Z125 Encounter for screening for malignant neoplasm of prostate: Secondary | ICD-10-CM | POA: Diagnosis not present

## 2022-01-06 DIAGNOSIS — Z0001 Encounter for general adult medical examination with abnormal findings: Secondary | ICD-10-CM | POA: Diagnosis not present

## 2022-01-06 DIAGNOSIS — Z136 Encounter for screening for cardiovascular disorders: Secondary | ICD-10-CM | POA: Diagnosis not present

## 2022-01-06 DIAGNOSIS — Z79899 Other long term (current) drug therapy: Secondary | ICD-10-CM | POA: Diagnosis not present

## 2022-01-06 DIAGNOSIS — N401 Enlarged prostate with lower urinary tract symptoms: Secondary | ICD-10-CM | POA: Diagnosis not present

## 2022-02-07 DIAGNOSIS — L718 Other rosacea: Secondary | ICD-10-CM | POA: Diagnosis not present

## 2022-02-07 DIAGNOSIS — L814 Other melanin hyperpigmentation: Secondary | ICD-10-CM | POA: Diagnosis not present

## 2022-02-07 DIAGNOSIS — L570 Actinic keratosis: Secondary | ICD-10-CM | POA: Diagnosis not present

## 2022-02-07 DIAGNOSIS — L84 Corns and callosities: Secondary | ICD-10-CM | POA: Diagnosis not present

## 2022-02-07 DIAGNOSIS — L821 Other seborrheic keratosis: Secondary | ICD-10-CM | POA: Diagnosis not present

## 2022-02-07 DIAGNOSIS — D225 Melanocytic nevi of trunk: Secondary | ICD-10-CM | POA: Diagnosis not present

## 2022-02-11 DIAGNOSIS — Z823 Family history of stroke: Secondary | ICD-10-CM | POA: Diagnosis not present

## 2022-02-11 DIAGNOSIS — L309 Dermatitis, unspecified: Secondary | ICD-10-CM | POA: Diagnosis not present

## 2022-02-11 DIAGNOSIS — M1990 Unspecified osteoarthritis, unspecified site: Secondary | ICD-10-CM | POA: Diagnosis not present

## 2022-02-11 DIAGNOSIS — Z820 Family history of epilepsy and other diseases of the nervous system: Secondary | ICD-10-CM | POA: Diagnosis not present

## 2022-02-11 DIAGNOSIS — J309 Allergic rhinitis, unspecified: Secondary | ICD-10-CM | POA: Diagnosis not present

## 2022-02-11 DIAGNOSIS — G629 Polyneuropathy, unspecified: Secondary | ICD-10-CM | POA: Diagnosis not present

## 2022-02-11 DIAGNOSIS — Z008 Encounter for other general examination: Secondary | ICD-10-CM | POA: Diagnosis not present

## 2022-02-11 DIAGNOSIS — Z803 Family history of malignant neoplasm of breast: Secondary | ICD-10-CM | POA: Diagnosis not present

## 2022-09-14 ENCOUNTER — Ambulatory Visit: Payer: Medicare HMO | Admitting: Neurology

## 2022-10-05 DIAGNOSIS — Z0100 Encounter for examination of eyes and vision without abnormal findings: Secondary | ICD-10-CM | POA: Diagnosis not present

## 2022-10-05 DIAGNOSIS — H5213 Myopia, bilateral: Secondary | ICD-10-CM | POA: Diagnosis not present

## 2022-11-10 DIAGNOSIS — M25561 Pain in right knee: Secondary | ICD-10-CM | POA: Diagnosis not present

## 2022-11-10 DIAGNOSIS — R600 Localized edema: Secondary | ICD-10-CM | POA: Diagnosis not present

## 2023-01-03 ENCOUNTER — Ambulatory Visit: Payer: Medicare HMO | Admitting: Neurology

## 2023-01-03 ENCOUNTER — Encounter: Payer: Self-pay | Admitting: Neurology

## 2023-01-03 VITALS — BP 111/69 | HR 73 | Ht 77.0 in | Wt 233.0 lb

## 2023-01-03 DIAGNOSIS — G609 Hereditary and idiopathic neuropathy, unspecified: Secondary | ICD-10-CM

## 2023-01-03 NOTE — Progress Notes (Signed)
Thomas Mclaughlin - Initial Visit   Date: 01/03/2023   Thomas BAUMLER MRN: [id number] DOB: 05-Sep-1956   Dear Dr. Dorthy Cooler:   Thank you for your kind referral of [place] for consultation of neuropathy. Although his history is well known to you, please allow Thomas Mclaughlin to reiterate it for the purpose of our medical record. The patient was accompanied to the clinic by self.    Thomas Mclaughlin is a 67 y.o. right-handed male with degenerative changes of the cervical spine presenting for evaluation of neuropathy.   IMPRESSION/PLAN: Idiopathic peripheral neuropathy manifesting with distal numbness and trace weakness.  Overall, clinically stable without significant progression since last evaluation in 2020.  It was explained that NCS/EMG cannot predict how his neuropathy will evolve over time and it is more of diagnostic tool.  I reviewed the management and natural course of neuropathy. Neuropathy tends to be slowly progressive and management is symptomatic.    - Continue to monitor  - He does not have pain to warrant medication   - Patient educated on daily foot inspection, fall prevention, and safety precautions around the home.  Return to clinic in as needed  ------------------------------------------------------------- History of present illness: Starting around 2010, he developed numbness involving the toes and sole of the feet. He has occasional tingling of the feet and on rare spells, he experienced sharp burning pain.  Symptoms are worse in the evening and relatively constant.  He denies any alleviating or exacerbating factors.  EMG performed in September 2016 by myself which was consistent with a distal and symmetric polyneuropathy. Labs showed normal TSH and vitamin B12.    He has a 30-year history of stiffness involving the last two fingers, no associated numbness or tingling.  Denies any low back pain.    He was last seen in 2020 for neuropathy.  Today, he presents with mild progression of neuropathy with numbness extending into the top of the feet and soles of the feet, towards the heel.  It is more noticeable in the evenings.  He does not have tingling or burning in the feet.  No weakness or imbalance.  He is tried taking a medication that his sister-in-law brought from [place], but stopped it because it did not provide any benefit. He was inquiring whether repeat NCS/EMG would predict how his neuropathy would evolve over time.   Out-side paper records, electronic medical record, and images have been reviewed where available and summarized as:  No results found for: "HGBA1C" Lab Results  Component Value Date   VITAMINB12 383 09/27/2019   No results found for: "TSH" Lab Results  Component Value Date   ESRSEDRATE 5 10/14/2015    Past Medical History:  Diagnosis Date   DJD (degenerative joint disease), cervical    Overweight     Past Surgical History:  Procedure Laterality Date   VASECTOMY       Medications:  Outpatient Encounter Medications as of 01/03/2023  Medication Sig Mclaughlin   fluticasone (FLONASE) 50 MCG/ACT nasal spray Place 1 spray into both nostrils 2 (two) times daily.    Loratadine 10 MG CAPS     metronidazole (NORITATE) 1 % cream Apply topically daily.    [DISCONTINUED] Alpha-Lipoic Acid 150 MG CAPS Take by mouth.    [DISCONTINUED] azelastine (ASTELIN) 0.1 % nasal spray Place 1 spray into both nostrils 2 (two) times daily. Use in each nostril as directed (Patient not taking: Reported on 09/27/2019)    [DISCONTINUED] Azelastine-Fluticasone (DYMISTA)  137-50 MCG/ACT SUSP Place 1 spray into both nostrils 2 (two) times daily. (Patient not taking: Reported on 09/27/2019)    [DISCONTINUED] ferrous sulfate 325 (65 FE) MG tablet Take 325 mg by mouth daily with breakfast.    [DISCONTINUED] Multiple Vitamins-Minerals (MULTIVITAMIN ADULT PO) Take by mouth.    [DISCONTINUED] phenylephrine (SUDAFED PE) 10 MG TABS tablet Take 10  mg by mouth every 4 (four) hours as needed. Reported on 04/11/2016    [DISCONTINUED] vitamin B-12 (CYANOCOBALAMIN) 1000 MCG tablet Take by mouth. 04/11/2016: Received from: [facility]   No facility-administered encounter medications on file as of 01/03/2023.    Allergies:  Allergies  Allergen Reactions   Amoxil [Amoxicillin]     Family History: Family History  Problem Relation Age of Onset   CVA Father        Deceased   Dementia Mother    Breast cancer Mother        Deceased   Leukemia Sister    Breast cancer Sister    Healthy Daughter    Healthy Son    Neuropathy Other        Neice   Allergic rhinitis Neg Hx    Angioedema Neg Hx    Asthma Neg Hx    Atopy Neg Hx    Eczema Neg Hx    Immunodeficiency Neg Hx    Urticaria Neg Hx     Social History: Social History   Tobacco Use   Smoking status: Never   Smokeless tobacco: Never  Vaping Use   Vaping Use: Never used  Substance Use Topics   Alcohol use: Yes    Alcohol/week: 0.0 standard drinks of alcohol    Comment: 4-8 times per month, 1 glass of wine   Drug use: No   Social History   Social History Narrative   Married with 2 children.     Self employed in publishing.  Lives in a 2 story home.     Education: Oceanographer.  Starting a new job on October 12, 2015.     Right handed    Vital Signs:  BP 111/69   Pulse 73   Ht '6\' 5"'$  (1.956 m)   Wt 233 lb (105.7 kg)   SpO2 99%   BMI 27.63 kg/m   Neurological Exam: MENTAL STATUS including orientation to time, place, person, recent and remote memory, attention span and concentration, language, and fund of knowledge is normal.  Speech is not dysarthric.  CRANIAL NERVES: II:  No visual field defects.     III-IV-VI: Pupils equal round and reactive to light.  Normal conjugate, extra-ocular eye movements in all directions of gaze.  No nystagmus.  No ptosis.   V:  Normal facial sensation.    VII:  Normal facial symmetry and movements.   VIII:  Normal  hearing and vestibular function.   IX-X:  Normal palatal movement.   XI:  Normal shoulder shrug and head rotation.   XII:  Normal tongue strength and range of motion, no deviation or fasciculation.  MOTOR:  No atrophy, fasciculations or abnormal movements.  No pronator drift.   Upper Extremity:  Right  Left  Deltoid  5/5   5/5   Biceps  5/5   5/5   Triceps  5/5   5/5   Wrist extensors  5/5   5/5   Wrist flexors  5/5   5/5   Finger extensors  5/5   5/5   Finger flexors  5/5  5/5   Dorsal interossei  5/5   5/5   Abductor pollicis  5/5   5/5   Tone (Ashworth scale)  0  0   Lower Extremity:  Right  Left  Hip flexors  5/5   5/5   Knee flexors  5/5   5/5   Knee extensors  5/5   5/5   Dorsiflexors  5/5   5/5   Plantarflexors  5/5   5/5   Toe extensors  5/5   5/5   Toe flexors  4/5   4/5   Tone (Ashworth scale)  0  0   MSRs:                                           Right        Left brachioradialis 2+  2+  biceps 2+  2+  triceps 2+  2+  patellar 2+  2+  ankle jerk 0  0  Hoffman no  no  plantar response down  down   SENSORY:  Vibration reduced at the ankles, absent at the great toe.  Pin prick intact throughout.  Temperature reduced over the feet bilaterally.  Romberg's sign absent.   COORDINATION/GAIT: Normal finger-to- nose-finger.  Intact rapid alternating movements bilaterally.  Gait narrow based and stable. Tandem and stressed gait intact.    Thank you for allowing me to participate in patient's care.  If I can answer any additional questions, I would be pleased to do so.    Sincerely,    Tyesha Joffe K. Posey Pronto, DO

## 2023-01-16 DIAGNOSIS — Z79899 Other long term (current) drug therapy: Secondary | ICD-10-CM | POA: Diagnosis not present

## 2023-01-16 DIAGNOSIS — J069 Acute upper respiratory infection, unspecified: Secondary | ICD-10-CM | POA: Diagnosis not present

## 2023-01-16 DIAGNOSIS — R051 Acute cough: Secondary | ICD-10-CM | POA: Diagnosis not present

## 2023-01-16 DIAGNOSIS — Z0001 Encounter for general adult medical examination with abnormal findings: Secondary | ICD-10-CM | POA: Diagnosis not present

## 2023-01-16 DIAGNOSIS — Z125 Encounter for screening for malignant neoplasm of prostate: Secondary | ICD-10-CM | POA: Diagnosis not present

## 2023-01-16 DIAGNOSIS — G5793 Unspecified mononeuropathy of bilateral lower limbs: Secondary | ICD-10-CM | POA: Diagnosis not present

## 2023-01-16 DIAGNOSIS — N401 Enlarged prostate with lower urinary tract symptoms: Secondary | ICD-10-CM | POA: Diagnosis not present

## 2023-01-30 DIAGNOSIS — Z008 Encounter for other general examination: Secondary | ICD-10-CM | POA: Diagnosis not present

## 2023-01-30 DIAGNOSIS — N400 Enlarged prostate without lower urinary tract symptoms: Secondary | ICD-10-CM | POA: Diagnosis not present

## 2023-01-30 DIAGNOSIS — N529 Male erectile dysfunction, unspecified: Secondary | ICD-10-CM | POA: Diagnosis not present

## 2023-01-30 DIAGNOSIS — Z880 Allergy status to penicillin: Secondary | ICD-10-CM | POA: Diagnosis not present

## 2023-01-30 DIAGNOSIS — J309 Allergic rhinitis, unspecified: Secondary | ICD-10-CM | POA: Diagnosis not present

## 2023-01-30 DIAGNOSIS — G629 Polyneuropathy, unspecified: Secondary | ICD-10-CM | POA: Diagnosis not present

## 2023-01-30 DIAGNOSIS — L719 Rosacea, unspecified: Secondary | ICD-10-CM | POA: Diagnosis not present

## 2023-02-07 DIAGNOSIS — L814 Other melanin hyperpigmentation: Secondary | ICD-10-CM | POA: Diagnosis not present

## 2023-02-07 DIAGNOSIS — L821 Other seborrheic keratosis: Secondary | ICD-10-CM | POA: Diagnosis not present

## 2023-02-07 DIAGNOSIS — L570 Actinic keratosis: Secondary | ICD-10-CM | POA: Diagnosis not present

## 2023-02-07 DIAGNOSIS — D225 Melanocytic nevi of trunk: Secondary | ICD-10-CM | POA: Diagnosis not present

## 2023-02-22 DIAGNOSIS — G5793 Unspecified mononeuropathy of bilateral lower limbs: Secondary | ICD-10-CM | POA: Diagnosis not present

## 2023-02-22 DIAGNOSIS — R29818 Other symptoms and signs involving the nervous system: Secondary | ICD-10-CM | POA: Diagnosis not present

## 2023-03-16 DIAGNOSIS — M25569 Pain in unspecified knee: Secondary | ICD-10-CM | POA: Diagnosis not present

## 2023-03-16 DIAGNOSIS — M25559 Pain in unspecified hip: Secondary | ICD-10-CM | POA: Diagnosis not present

## 2023-03-16 DIAGNOSIS — M25551 Pain in right hip: Secondary | ICD-10-CM | POA: Diagnosis not present

## 2023-03-16 DIAGNOSIS — M25561 Pain in right knee: Secondary | ICD-10-CM | POA: Diagnosis not present

## 2023-03-20 DIAGNOSIS — M25561 Pain in right knee: Secondary | ICD-10-CM | POA: Diagnosis not present

## 2023-03-20 DIAGNOSIS — M25569 Pain in unspecified knee: Secondary | ICD-10-CM | POA: Diagnosis not present

## 2023-03-20 DIAGNOSIS — M25551 Pain in right hip: Secondary | ICD-10-CM | POA: Diagnosis not present

## 2023-03-20 DIAGNOSIS — M25559 Pain in unspecified hip: Secondary | ICD-10-CM | POA: Diagnosis not present

## 2023-03-22 DIAGNOSIS — M25561 Pain in right knee: Secondary | ICD-10-CM | POA: Diagnosis not present

## 2023-03-22 DIAGNOSIS — M25559 Pain in unspecified hip: Secondary | ICD-10-CM | POA: Diagnosis not present

## 2023-03-22 DIAGNOSIS — M25569 Pain in unspecified knee: Secondary | ICD-10-CM | POA: Diagnosis not present

## 2023-03-22 DIAGNOSIS — M25551 Pain in right hip: Secondary | ICD-10-CM | POA: Diagnosis not present

## 2023-03-27 DIAGNOSIS — M25561 Pain in right knee: Secondary | ICD-10-CM | POA: Diagnosis not present

## 2023-03-27 DIAGNOSIS — M25551 Pain in right hip: Secondary | ICD-10-CM | POA: Diagnosis not present

## 2023-03-27 DIAGNOSIS — M25569 Pain in unspecified knee: Secondary | ICD-10-CM | POA: Diagnosis not present

## 2023-03-27 DIAGNOSIS — M25559 Pain in unspecified hip: Secondary | ICD-10-CM | POA: Diagnosis not present

## 2023-03-29 DIAGNOSIS — M25561 Pain in right knee: Secondary | ICD-10-CM | POA: Diagnosis not present

## 2023-03-29 DIAGNOSIS — M25569 Pain in unspecified knee: Secondary | ICD-10-CM | POA: Diagnosis not present

## 2023-03-29 DIAGNOSIS — M25551 Pain in right hip: Secondary | ICD-10-CM | POA: Diagnosis not present

## 2023-03-29 DIAGNOSIS — M25559 Pain in unspecified hip: Secondary | ICD-10-CM | POA: Diagnosis not present

## 2023-04-11 DIAGNOSIS — M25561 Pain in right knee: Secondary | ICD-10-CM | POA: Diagnosis not present

## 2023-04-13 DIAGNOSIS — M25559 Pain in unspecified hip: Secondary | ICD-10-CM | POA: Diagnosis not present

## 2023-04-13 DIAGNOSIS — M25551 Pain in right hip: Secondary | ICD-10-CM | POA: Diagnosis not present

## 2023-04-13 DIAGNOSIS — M25569 Pain in unspecified knee: Secondary | ICD-10-CM | POA: Diagnosis not present

## 2023-04-13 DIAGNOSIS — M25561 Pain in right knee: Secondary | ICD-10-CM | POA: Diagnosis not present

## 2023-04-17 DIAGNOSIS — M25569 Pain in unspecified knee: Secondary | ICD-10-CM | POA: Diagnosis not present

## 2023-04-17 DIAGNOSIS — M25551 Pain in right hip: Secondary | ICD-10-CM | POA: Diagnosis not present

## 2023-04-17 DIAGNOSIS — M25559 Pain in unspecified hip: Secondary | ICD-10-CM | POA: Diagnosis not present

## 2023-04-17 DIAGNOSIS — M25561 Pain in right knee: Secondary | ICD-10-CM | POA: Diagnosis not present

## 2023-04-19 DIAGNOSIS — M25569 Pain in unspecified knee: Secondary | ICD-10-CM | POA: Diagnosis not present

## 2023-04-19 DIAGNOSIS — M25559 Pain in unspecified hip: Secondary | ICD-10-CM | POA: Diagnosis not present

## 2023-04-19 DIAGNOSIS — M25561 Pain in right knee: Secondary | ICD-10-CM | POA: Diagnosis not present

## 2023-04-19 DIAGNOSIS — M25551 Pain in right hip: Secondary | ICD-10-CM | POA: Diagnosis not present

## 2023-04-24 DIAGNOSIS — M25559 Pain in unspecified hip: Secondary | ICD-10-CM | POA: Diagnosis not present

## 2023-04-24 DIAGNOSIS — M25569 Pain in unspecified knee: Secondary | ICD-10-CM | POA: Diagnosis not present

## 2023-04-24 DIAGNOSIS — M25551 Pain in right hip: Secondary | ICD-10-CM | POA: Diagnosis not present

## 2023-04-24 DIAGNOSIS — M25561 Pain in right knee: Secondary | ICD-10-CM | POA: Diagnosis not present

## 2023-04-26 DIAGNOSIS — M25559 Pain in unspecified hip: Secondary | ICD-10-CM | POA: Diagnosis not present

## 2023-04-26 DIAGNOSIS — M25551 Pain in right hip: Secondary | ICD-10-CM | POA: Diagnosis not present

## 2023-04-26 DIAGNOSIS — M25569 Pain in unspecified knee: Secondary | ICD-10-CM | POA: Diagnosis not present

## 2023-04-26 DIAGNOSIS — M25561 Pain in right knee: Secondary | ICD-10-CM | POA: Diagnosis not present

## 2023-05-01 DIAGNOSIS — M25559 Pain in unspecified hip: Secondary | ICD-10-CM | POA: Diagnosis not present

## 2023-05-01 DIAGNOSIS — M25551 Pain in right hip: Secondary | ICD-10-CM | POA: Diagnosis not present

## 2023-05-01 DIAGNOSIS — M25561 Pain in right knee: Secondary | ICD-10-CM | POA: Diagnosis not present

## 2023-05-01 DIAGNOSIS — M25569 Pain in unspecified knee: Secondary | ICD-10-CM | POA: Diagnosis not present

## 2023-10-10 DIAGNOSIS — H5213 Myopia, bilateral: Secondary | ICD-10-CM | POA: Diagnosis not present

## 2023-10-30 DIAGNOSIS — M25561 Pain in right knee: Secondary | ICD-10-CM | POA: Diagnosis not present

## 2023-10-30 DIAGNOSIS — M25551 Pain in right hip: Secondary | ICD-10-CM | POA: Diagnosis not present

## 2023-10-30 DIAGNOSIS — M25559 Pain in unspecified hip: Secondary | ICD-10-CM | POA: Diagnosis not present

## 2023-10-30 DIAGNOSIS — M25569 Pain in unspecified knee: Secondary | ICD-10-CM | POA: Diagnosis not present

## 2023-11-28 DIAGNOSIS — M545 Low back pain, unspecified: Secondary | ICD-10-CM | POA: Diagnosis not present

## 2023-12-07 ENCOUNTER — Other Ambulatory Visit: Payer: Self-pay | Admitting: Family Medicine

## 2023-12-07 ENCOUNTER — Ambulatory Visit
Admission: RE | Admit: 2023-12-07 | Discharge: 2023-12-07 | Disposition: A | Discharge status: Home / Self Care | Payer: Medicare HMO | Source: Ambulatory Visit | Attending: Family Medicine | Admitting: Family Medicine

## 2023-12-07 ENCOUNTER — Encounter: Payer: Self-pay | Admitting: Family Medicine

## 2023-12-07 DIAGNOSIS — M5126 Other intervertebral disc displacement, lumbar region: Secondary | ICD-10-CM | POA: Diagnosis not present

## 2023-12-07 DIAGNOSIS — M545 Low back pain, unspecified: Secondary | ICD-10-CM

## 2023-12-07 DIAGNOSIS — M47816 Spondylosis without myelopathy or radiculopathy, lumbar region: Secondary | ICD-10-CM | POA: Diagnosis not present

## 2023-12-25 DIAGNOSIS — Z6827 Body mass index (BMI) 27.0-27.9, adult: Secondary | ICD-10-CM | POA: Diagnosis not present

## 2023-12-25 DIAGNOSIS — M5126 Other intervertebral disc displacement, lumbar region: Secondary | ICD-10-CM | POA: Diagnosis not present

## 2023-12-28 DIAGNOSIS — M545 Low back pain, unspecified: Secondary | ICD-10-CM | POA: Diagnosis not present

## 2024-01-02 DIAGNOSIS — M545 Low back pain, unspecified: Secondary | ICD-10-CM | POA: Diagnosis not present

## 2024-01-16 DIAGNOSIS — M545 Low back pain, unspecified: Secondary | ICD-10-CM | POA: Diagnosis not present

## 2024-01-23 DIAGNOSIS — M545 Low back pain, unspecified: Secondary | ICD-10-CM | POA: Diagnosis not present

## 2024-01-24 DIAGNOSIS — Z0001 Encounter for general adult medical examination with abnormal findings: Secondary | ICD-10-CM | POA: Diagnosis not present

## 2024-01-24 DIAGNOSIS — N401 Enlarged prostate with lower urinary tract symptoms: Secondary | ICD-10-CM | POA: Diagnosis not present

## 2024-01-24 DIAGNOSIS — Z125 Encounter for screening for malignant neoplasm of prostate: Secondary | ICD-10-CM | POA: Diagnosis not present

## 2024-01-24 DIAGNOSIS — G5793 Unspecified mononeuropathy of bilateral lower limbs: Secondary | ICD-10-CM | POA: Diagnosis not present

## 2024-01-24 DIAGNOSIS — Z79899 Other long term (current) drug therapy: Secondary | ICD-10-CM | POA: Diagnosis not present

## 2024-01-24 DIAGNOSIS — Z1331 Encounter for screening for depression: Secondary | ICD-10-CM | POA: Diagnosis not present

## 2024-01-25 DIAGNOSIS — M5126 Other intervertebral disc displacement, lumbar region: Secondary | ICD-10-CM | POA: Diagnosis not present

## 2024-01-25 DIAGNOSIS — Z6827 Body mass index (BMI) 27.0-27.9, adult: Secondary | ICD-10-CM | POA: Diagnosis not present

## 2024-01-29 DIAGNOSIS — L718 Other rosacea: Secondary | ICD-10-CM | POA: Diagnosis not present

## 2024-01-29 DIAGNOSIS — D225 Melanocytic nevi of trunk: Secondary | ICD-10-CM | POA: Diagnosis not present

## 2024-01-29 DIAGNOSIS — L814 Other melanin hyperpigmentation: Secondary | ICD-10-CM | POA: Diagnosis not present

## 2024-01-29 DIAGNOSIS — L821 Other seborrheic keratosis: Secondary | ICD-10-CM | POA: Diagnosis not present

## 2024-01-30 DIAGNOSIS — M545 Low back pain, unspecified: Secondary | ICD-10-CM | POA: Diagnosis not present

## 2024-02-13 DIAGNOSIS — M542 Cervicalgia: Secondary | ICD-10-CM | POA: Diagnosis not present

## 2024-02-13 DIAGNOSIS — K080 Exfoliation of teeth due to systemic causes: Secondary | ICD-10-CM | POA: Diagnosis not present

## 2024-02-13 DIAGNOSIS — M545 Low back pain, unspecified: Secondary | ICD-10-CM | POA: Diagnosis not present

## 2024-02-20 DIAGNOSIS — M545 Low back pain, unspecified: Secondary | ICD-10-CM | POA: Diagnosis not present

## 2024-02-20 DIAGNOSIS — M542 Cervicalgia: Secondary | ICD-10-CM | POA: Diagnosis not present

## 2024-02-27 DIAGNOSIS — M545 Low back pain, unspecified: Secondary | ICD-10-CM | POA: Diagnosis not present

## 2024-02-27 DIAGNOSIS — M542 Cervicalgia: Secondary | ICD-10-CM | POA: Diagnosis not present

## 2024-04-16 DIAGNOSIS — M545 Low back pain, unspecified: Secondary | ICD-10-CM | POA: Diagnosis not present

## 2024-04-16 DIAGNOSIS — M542 Cervicalgia: Secondary | ICD-10-CM | POA: Diagnosis not present

## 2024-04-24 DIAGNOSIS — R972 Elevated prostate specific antigen [PSA]: Secondary | ICD-10-CM | POA: Diagnosis not present

## 2024-07-08 DIAGNOSIS — K648 Other hemorrhoids: Secondary | ICD-10-CM | POA: Diagnosis not present

## 2024-07-08 DIAGNOSIS — K5730 Diverticulosis of large intestine without perforation or abscess without bleeding: Secondary | ICD-10-CM | POA: Diagnosis not present

## 2024-07-08 DIAGNOSIS — Z1211 Encounter for screening for malignant neoplasm of colon: Secondary | ICD-10-CM | POA: Diagnosis not present

## 2024-09-23 DIAGNOSIS — H5213 Myopia, bilateral: Secondary | ICD-10-CM | POA: Diagnosis not present

## 2024-11-04 DIAGNOSIS — H6123 Impacted cerumen, bilateral: Secondary | ICD-10-CM | POA: Diagnosis not present

## 2024-11-04 DIAGNOSIS — J0100 Acute maxillary sinusitis, unspecified: Secondary | ICD-10-CM | POA: Diagnosis not present
